# Patient Record
Sex: Female | Born: 1995 | Hispanic: Yes | Marital: Single | Smoking: Current some day smoker
Health system: Southern US, Community
[De-identification: ages and names within clinical notes are randomized; demographics above are authoritative.]

---

## 2019-09-14 ENCOUNTER — Other Ambulatory Visit: Payer: Self-pay

## 2019-09-14 ENCOUNTER — Emergency Department
Admission: EM | Admit: 2019-09-14 | Discharge: 2019-09-14 | Disposition: A | Discharge status: Home / Self Care | Payer: Self-pay | Attending: Emergency Medicine | Admitting: Emergency Medicine

## 2019-09-14 ENCOUNTER — Encounter: Payer: Self-pay | Admitting: Emergency Medicine

## 2019-09-14 ENCOUNTER — Emergency Department: Payer: Self-pay

## 2019-09-14 DIAGNOSIS — F17200 Nicotine dependence, unspecified, uncomplicated: Secondary | ICD-10-CM | POA: Insufficient documentation

## 2019-09-14 DIAGNOSIS — R112 Nausea with vomiting, unspecified: Secondary | ICD-10-CM | POA: Insufficient documentation

## 2019-09-14 LAB — URINALYSIS, COMPLETE (UACMP) WITH MICROSCOPIC
Bilirubin Urine: NEGATIVE
Glucose, UA: NEGATIVE mg/dL
Hgb urine dipstick: NEGATIVE
Ketones, ur: NEGATIVE mg/dL
Nitrite: NEGATIVE
Protein, ur: NEGATIVE mg/dL
Specific Gravity, Urine: 1.024 (ref 1.005–1.030)
pH: 6 (ref 5.0–8.0)

## 2019-09-14 LAB — CBC
HCT: 40.7 % (ref 36.0–46.0)
Hemoglobin: 13.8 g/dL (ref 12.0–15.0)
MCH: 28.6 pg (ref 26.0–34.0)
MCHC: 33.9 g/dL (ref 30.0–36.0)
MCV: 84.3 fL (ref 80.0–100.0)
Platelets: 370 10*3/uL (ref 150–400)
RBC: 4.83 MIL/uL (ref 3.87–5.11)
RDW: 13.2 % (ref 11.5–15.5)
WBC: 13.3 10*3/uL — ABNORMAL HIGH (ref 4.0–10.5)
nRBC: 0 % (ref 0.0–0.2)

## 2019-09-14 LAB — COMPREHENSIVE METABOLIC PANEL
ALT: 13 U/L (ref 0–44)
AST: 19 U/L (ref 15–41)
Albumin: 4.4 g/dL (ref 3.5–5.0)
Alkaline Phosphatase: 61 U/L (ref 38–126)
Anion gap: 11 (ref 5–15)
BUN: 16 mg/dL (ref 6–20)
CO2: 22 mmol/L (ref 22–32)
Calcium: 8.8 mg/dL — ABNORMAL LOW (ref 8.9–10.3)
Chloride: 104 mmol/L (ref 98–111)
Creatinine, Ser: 0.82 mg/dL (ref 0.44–1.00)
GFR calc Af Amer: >60 mL/min (ref >60)
GFR calc non Af Amer: >60 mL/min (ref >60)
Glucose, Bld: 99 mg/dL (ref 70–99)
Potassium: 3.1 mmol/L — ABNORMAL LOW (ref 3.5–5.1)
Sodium: 137 mmol/L (ref 135–145)
Total Bilirubin: 0.6 mg/dL (ref 0.3–1.2)
Total Protein: 8.4 g/dL — ABNORMAL HIGH (ref 6.5–8.1)

## 2019-09-14 LAB — TROPONIN I (HIGH SENSITIVITY)
Troponin I (High Sensitivity): 2 ng/L (ref <18)
Troponin I (High Sensitivity): <2 ng/L (ref <18)

## 2019-09-14 LAB — LIPASE, BLOOD: Lipase: 25 U/L (ref 11–51)

## 2019-09-14 LAB — POCT PREGNANCY, URINE: Preg Test, Ur: NEGATIVE

## 2019-09-14 MED ORDER — ALUM & MAG HYDROXIDE-SIMETH 200-200-20 MG/5ML PO SUSP
30.0000 mL | Freq: Once | ORAL | Status: AC
  Administered 2019-09-14: 30 mL via ORAL
  Filled 2019-09-14: qty 30

## 2019-09-14 MED ORDER — LIDOCAINE VISCOUS HCL 2 % MT SOLN
15.0000 mL | Freq: Once | OROMUCOSAL | Status: AC
  Administered 2019-09-14: 03:00:00 15 mL via ORAL
  Filled 2019-09-14: qty 15

## 2019-09-14 MED ORDER — ALUM & MAG HYDROXIDE-SIMETH 400-400-40 MG/5ML PO SUSP: 5.0000 mL | Freq: Four times a day (QID) | ORAL | 0 refills | Status: AC | PRN

## 2019-09-14 MED ORDER — ONDANSETRON 4 MG PO TBDP: 4.0000 mg | ORAL_TABLET | Freq: Three times a day (TID) | ORAL | 0 refills | Status: AC | PRN

## 2019-09-14 MED ORDER — ONDANSETRON HCL 4 MG/2ML IJ SOLN
4.0000 mg | Freq: Once | INTRAMUSCULAR | Status: AC | PRN
  Administered 2019-09-14: 4 mg via INTRAVENOUS
  Filled 2019-09-14: qty 2

## 2019-09-14 NOTE — ED Provider Notes (Signed)
[facility] Emergency Department Provider Note  ____________________________________________  Time seen: Approximately 6:19 AM  I have reviewed the triage vital signs and the nursing notes.   HISTORY  Chief Complaint Emesis   HPI Alexandria Daniels is a 24 y.o. female no significant past medical history who presents for evaluation of nausea and vomiting.  Patient reports buying a antiparasite medication at a [place] store and drinking one dose of it earlier today. She reports that this is something that she usually does back at home "to clean any possible parasites and to make her body stronger with vitamins." She denies taking more than the prescribed amount. She is not sure what the medication is called. She then went to work this evening and decided to drink 2 large cups of coffee.  Patient reports that she usually does not drink caffeine but she was feeling very sleepy and had to work [employer].  As soon as she finished the second coffee she started having nausea.  She vomited 2 or 3 times.  Nonbloody nonbilious.  She had some pressure in her epigastric region associated with that but that has resolved.  She denies any abdominal pain, diarrhea, constipation, chest pain, shortness of breath, fever, cough, dysuria or hematuria.  At this time patient feels back to normal.   PMH None - reviewed  Prior to Admission medications   Medication Sig Start Date End Date Taking? Authorizing Provider  ondansetron (ZOFRAN ODT) 4 MG disintegrating tablet Take 1 tablet (4 mg total) by mouth every 8 (eight) hours as needed. 09/14/19   Rudene Re, MD    Allergies Patient has no known allergies.  No family history on file.  Social History Social History   Tobacco Use  . Smoking status: Current Some Day Smoker  . Smokeless tobacco: Never Used  Substance Use Topics  . Alcohol use: Yes  . Drug use: Never    Review of Systems  Constitutional: Negative for  fever. Eyes: Negative for visual changes. ENT: Negative for sore throat. Neck: No neck pain  Cardiovascular: Negative for chest pain. Respiratory: Negative for shortness of breath. Gastrointestinal: Negative for abdominal pain or diarrhea. + N/V Genitourinary: Negative for dysuria. Musculoskeletal: Negative for back pain. Skin: Negative for rash. Neurological: Negative for headaches, weakness or numbness. Psych: No SI or HI  ____________________________________________   PHYSICAL EXAM:  VITAL SIGNS: ED Triage Vitals  Enc Vitals Group     BP 09/14/19 0225 124/80     Pulse Rate 09/14/19 0225 (!) 108     Resp 09/14/19 0225 18     Temp 09/14/19 0225 99.5 F (37.5 C)     Temp Source 09/14/19 0225 Oral     SpO2 09/14/19 0225 98 %     Weight 09/14/19 0229 160 lb (72.6 kg)     Height 09/14/19 0229 5' 4.96" (1.65 m)     Head Circumference --      Peak Flow --      Pain Score 09/14/19 0227 5     Pain Loc --      Pain Edu? --      Excl. in [place]? --     Constitutional: Alert and oriented. Well appearing and in no apparent distress. HEENT:      Head: Normocephalic and atraumatic.         Eyes: Conjunctivae are normal. Sclera is non-icteric.       Mouth/Throat: Mucous membranes are moist.       Neck: Supple with  no signs of meningismus. Cardiovascular: Regular rate and rhythm. No murmurs, gallops, or rubs. Respiratory: Normal respiratory effort. Lungs are clear to auscultation bilaterally. Gastrointestinal: Soft, non tender, and non distended with positive bowel sounds. No rebound or guarding. Musculoskeletal:  No edema, cyanosis, or erythema of extremities. Neurologic: Normal speech and language. Face is symmetric. Moving all extremities. No gross focal neurologic deficits are appreciated. Skin: Skin is warm, dry and intact. No rash noted. Psychiatric: Mood and affect are normal. Speech and behavior are normal.  ____________________________________________   LABS (all labs  ordered are listed, but only abnormal results are displayed)  Labs Reviewed  COMPREHENSIVE METABOLIC PANEL - Abnormal; Notable for the following components:      Result Value   Potassium 3.1 (*)    Calcium 8.8 (*)    Total Protein 8.4 (*)    All other components within normal limits  CBC - Abnormal; Notable for the following components:   WBC 13.3 (*)    All other components within normal limits  URINALYSIS, COMPLETE (UACMP) WITH MICROSCOPIC - Abnormal; Notable for the following components:   Color, Urine YELLOW (*)    APPearance HAZY (*)    Leukocytes,Ua SMALL (*)    Bacteria, UA MANY (*)    All other components within normal limits  LIPASE, BLOOD  POC URINE PREG, ED  POCT PREGNANCY, URINE  TROPONIN I (HIGH SENSITIVITY)  TROPONIN I (HIGH SENSITIVITY)   ____________________________________________  EKG  ED ECG REPORT I, Nita Sickle, the attending physician, personally viewed and interpreted this ECG.  Sinus tachycardia, rate of 119, normal intervals, normal axis, no ST elevations or depressions.  Normal EKG. ____________________________________________  RADIOLOGY  I have personally reviewed the images performed during this visit and I agree with the Radiologist's read.   Interpretation by Radiologist:  DG Chest 1 View  Result Date: 09/14/2019 CLINICAL DATA:  Vomiting. EXAM: CHEST  1 VIEW COMPARISON:  None. FINDINGS: The heart size and mediastinal contours are within normal limits. Both lungs are clear. The visualized skeletal structures are unremarkable. IMPRESSION: No active disease. Electronically Signed   By: Aram Candela M.D.   On: 09/14/2019 03:13    ____________________________________________   PROCEDURES  Procedure(s) performed: None Procedures Critical Care performed:  None ____________________________________________   INITIAL IMPRESSION / ASSESSMENT AND PLAN / ED COURSE   24 y.o. female no significant past medical history who presents  for evaluation of nausea and vomiting after ingesting a large amount of caffeine which she is not accustomed to.  Exam she is well-appearing, looks well-hydrated, has already received Zofran and fluid in triage.  Abdomen is soft with no tenderness throughout.  Initially tachycardic however that resolved after fluids.  Labs show mild leukocytosis most likely stress related.  No AKI or severe dehydration, no significant electrolyte derangements.  UA with some bacteria but patient is completely asymptomatic.  Troponin x2 -.  Pregnancy test negative.  Patient received Zofran and GI cocktail and had full resolution of her symptoms.  She tolerated p.o. with no further episodes of vomiting.  Her serial abdominal exams remain with no tenderness throughout.  Will discharge home with increase oral hydration, Zofran and bland diet for the next 24 hours.  Discussed close follow-up with PCP and my standard return precautions for any signs of abdominal pain or recurrent vomiting or fever.  Old medical records have been reviewed.       _____________________________________________ Please note:  Patient was evaluated in Emergency Department today for the symptoms described in  the history of present illness. Patient was evaluated in the context of the global COVID-19 pandemic, which necessitated consideration that the patient might be at risk for infection with the SARS-CoV-2 virus that causes COVID-19. Institutional protocols and algorithms that pertain to the evaluation of patients at risk for COVID-19 are in a state of rapid change based on information released by regulatory bodies including the CDC and federal and state organizations. These policies and algorithms were followed during the patient's care in the ED.  Some ED evaluations and interventions may be delayed as a result of limited staffing during the pandemic.   [place] Controlled Substance Database was reviewed by  me. ____________________________________________   FINAL CLINICAL IMPRESSION(S) / ED DIAGNOSES   Final diagnoses:  Non-intractable vomiting with nausea, unspecified vomiting type      NEW MEDICATIONS STARTED DURING THIS VISIT:  ED Discharge Orders         Ordered    ondansetron (ZOFRAN ODT) 4 MG disintegrating tablet  Every 8 hours PRN     09/14/19 0618           Note:  This document was prepared using Dragon voice recognition software and may include unintentional dictation errors.    Don Perking, [place], MD 09/15/19 [phone]

## 2019-09-14 NOTE — ED Notes (Signed)
Pt reports her nausea has subsided slightly. Pt resting in recliner at this time in NAD. Warm blanket given and RN used interpreter to confirm pt did not have further questions at this time.

## 2019-09-14 NOTE — ED Triage Notes (Signed)
Pt arrives from [facility] with c/o emesis in the last hour. Pt reports that she was taking an unknown medicine for a parasite that she says that she has taken in the past. Pt also reports drinking multiple cups of coffee.

## 2019-09-22 ENCOUNTER — Ambulatory Visit: Payer: Self-pay

## 2019-09-23 ENCOUNTER — Encounter: Payer: Self-pay | Admitting: Family Medicine

## 2019-09-23 ENCOUNTER — Ambulatory Visit (LOCAL_COMMUNITY_HEALTH_CENTER): Payer: Self-pay | Admitting: Physician Assistant

## 2019-09-23 ENCOUNTER — Other Ambulatory Visit: Payer: Self-pay

## 2019-09-23 VITALS — BP 112/72 | Ht 66.0 in | Wt 163.8 lb

## 2019-09-23 DIAGNOSIS — G43909 Migraine, unspecified, not intractable, without status migrainosus: Secondary | ICD-10-CM

## 2019-09-23 DIAGNOSIS — Z30013 Encounter for initial prescription of injectable contraceptive: Secondary | ICD-10-CM

## 2019-09-23 DIAGNOSIS — Z3009 Encounter for other general counseling and advice on contraception: Secondary | ICD-10-CM

## 2019-09-23 DIAGNOSIS — Z113 Encounter for screening for infections with a predominantly sexual mode of transmission: Secondary | ICD-10-CM

## 2019-09-23 MED ORDER — MEDROXYPROGESTERONE ACETATE 150 MG/ML IM SUSP
150.0000 mg | Freq: Once | INTRAMUSCULAR | Status: AC
  Administered 2019-09-23: 150 mg via INTRAMUSCULAR

## 2019-09-23 NOTE — Progress Notes (Signed)
In for BCM-desires Depo; was using monthly injection in [place]-last rec'd end 07/2019; unknown about pap smears; reports has had irreg bleeding x 1-1/2 months;  desires HIV/RPR testing Sharlette Dense, RN

## 2019-09-24 DIAGNOSIS — G43909 Migraine, unspecified, not intractable, without status migrainosus: Secondary | ICD-10-CM | POA: Insufficient documentation

## 2019-09-24 NOTE — Progress Notes (Signed)
Family Planning Visit-   Subjective:  Alexandria Daniels is a 24 y.o. being seen today  to discuss family planning options.    She is currently using none for pregnancy prevention. Patient reports she does not want a pregnancy in the next year. Patient  does not have a problem list on file.  Chief Complaint  Patient presents with  . Contraception    Patient reports that she is not having any symptoms of concern today.  States that she was using the monthly injection [place] and would like to start Depo today.  Reports that she has migraines with left side pain and nausea and vomiting.  Denies vision changes and aura.  Reports that she gets relief with OTC Ibuprofen.  Also, reports that she will occasionally smoke 1-2 cigarettes, but is not an every day smoker.   Patient denies any chronic conditions, surgeries, clotting disorders or regular medicines.  Denies family history of DM, HTN, heart dz, and cancer.   Does the patient desire a pregnancy in the next year? (OKQ flowsheet)  See flowsheet for other program required questions.   Body mass index is 26.44 kg/m. - Patient is eligible for diabetes screening based on BMI and age >46?  not applicable HA1C ordered? not applicable  Patient reports 1 of partners in last year. Desires STI screening?  No - .  Does the patient have a current or past history of drug use? No   No components found for: HCV]   Health Maintenance Due  Topic Date Due  . HIV Screening  Never done  . COVID-19 Vaccine (1) Never done  . TETANUS/TDAP  Never done  . PAP-Cervical Cytology Screening  Never done  . PAP SMEAR-Modifier  Never done    Review of Systems  All other systems reviewed and are negative.   The following portions of the patient's history were reviewed and updated as appropriate: allergies, current medications, past family history, past medical history, past social history, past surgical history and problem list. Problem list  updated.  Objective:   Vitals:   09/23/19 1545  BP: 112/72  Weight: 163 lb 12.8 oz (74.3 kg)  Height: 5\' 6"  (1.676 m)    Physical Exam Vitals and nursing note reviewed.  Constitutional:      General: She is not in acute distress.    Appearance: Normal appearance.  HENT:     Head: Normocephalic and atraumatic.  Eyes:     Conjunctiva/sclera: Conjunctivae normal.  Pulmonary:     Effort: Pulmonary effort is normal.  Neurological:     Mental Status: She is alert and oriented to person, place, and time.  Psychiatric:        Mood and Affect: Mood normal.        Behavior: Behavior normal.        Thought Content: Thought content normal.        Judgment: Judgment normal.       Assessment and Plan:  Alexandria Daniels is a 24 y.o. female presenting to the [facility] Department for a family planning visit  Contraception counseling: Reviewed all forms of birth control options in the tiered based approach. available including abstinence; over the counter/barrier methods; hormonal contraceptive medication including pill, patch, ring, injection,contraceptive implant; hormonal and nonhormonal IUDs; permanent sterilization options including vasectomy and the various tubal sterilization modalities. Risks, benefits, and typical effectiveness rates were reviewed.  Questions were answered.  Written information was also given to the patient to review.  Patient desires Depo, this was prescribed for patient. She will follow up in  3 months for RP and  for surveillance.  She was told to call with any further questions, or with any concerns about this method of contraception.  Emphasized use of condoms 100% of the time for STI prevention.  Patient was not a candidate for ECP today.    1. Encounter for counseling regarding contraception Counseled as above re:  BCM and emphasized differences between Depo and previous injection. Counseled when to call clinic for irregular bleeding. Rec condoms  with all sex for at least 2 weeks after shot today. RTC for next Depo and PE with pap.  Also, will need to fill out RHS sheet. - medroxyPROGESTERone (DEPO-PROVERA) injection 150 mg  2. Screening for STD (sexually transmitted disease) Await test results.  Counseled that RN will call if needs to RTC once results are back.  - HIV [facility] - Syphilis Serology, [facility]  3. Initiation of Depo Provera Depo 150mg  IM today, patient requests in hip. Call with questions or concerns. - medroxyPROGESTERone (DEPO-PROVERA) injection 150 mg     No follow-ups on file.  No future appointments.  Jerene Dilling, PA

## 2021-09-19 IMAGING — CR DG CHEST 1V
1 series · 1 of 1 positions shown · non-contrast
Comparison: None.

CLINICAL DATA: Vomiting.

EXAM:
CHEST  1 VIEW

[chest pa]
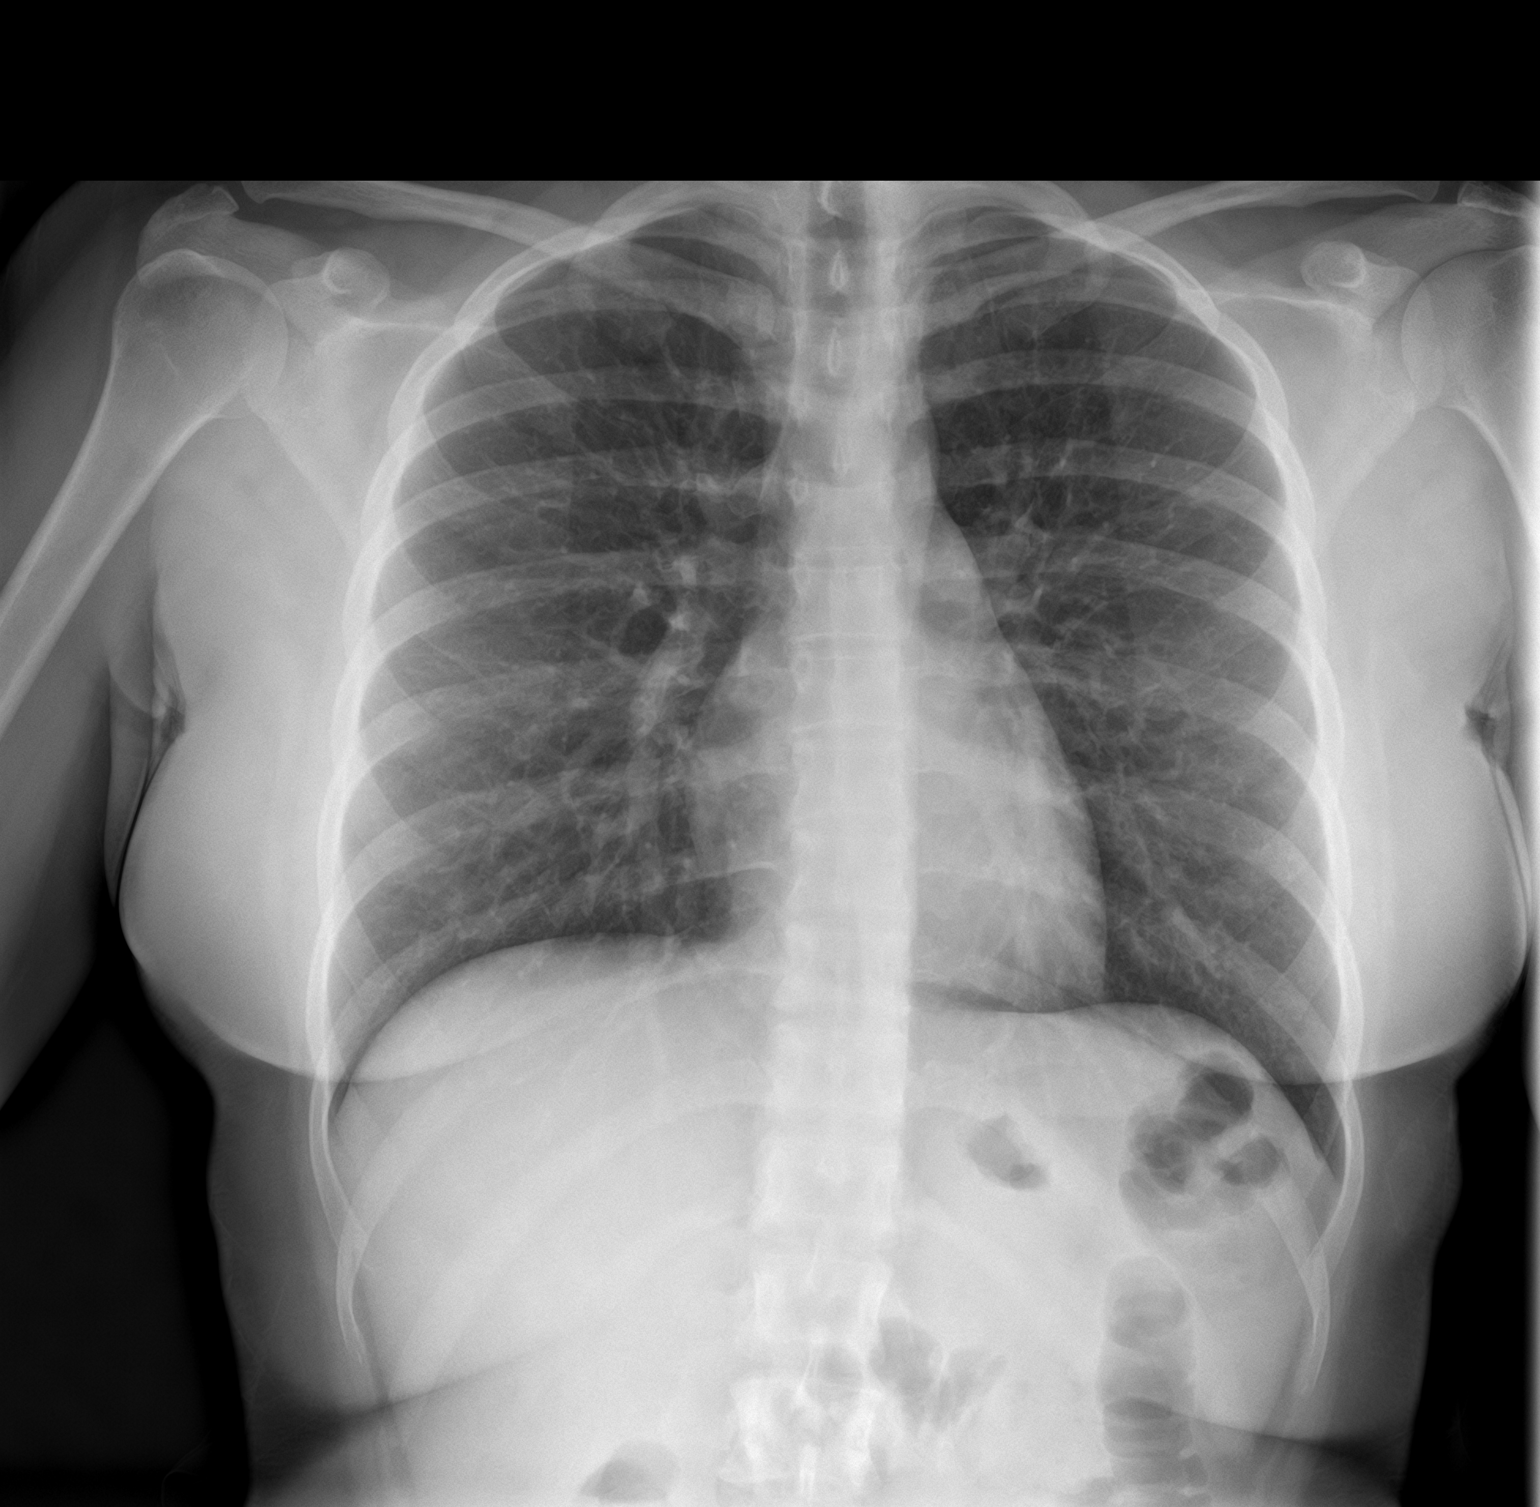

[1 of 1 positions shown; findings below may reference images not displayed]

FINDINGS: The heart size and mediastinal contours are within normal limits.
Both lungs are clear. The visualized skeletal structures are
unremarkable.
IMPRESSION: No active disease.
